# Patient Record
Sex: Female | Born: 2000 | Race: Black or African American | Hispanic: No | Marital: Single
Health system: Southern US, Community
[De-identification: ages and names within clinical notes are randomized; demographics above are authoritative.]

---
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
## 2021-10-01 ENCOUNTER — Emergency Department (HOSPITAL_COMMUNITY)
Admission: EM | Admit: 2021-10-01 | Discharge: 2021-10-01 | Disposition: A | Discharge status: Home / Self Care | Payer: 59 | Attending: Emergency Medicine | Admitting: Emergency Medicine

## 2021-10-01 ENCOUNTER — Other Ambulatory Visit: Payer: Self-pay

## 2021-10-01 ENCOUNTER — Emergency Department (HOSPITAL_COMMUNITY): Payer: 59

## 2021-10-01 DIAGNOSIS — R197 Diarrhea, unspecified: Secondary | ICD-10-CM | POA: Diagnosis not present

## 2021-10-01 DIAGNOSIS — R1084 Generalized abdominal pain: Secondary | ICD-10-CM | POA: Diagnosis not present

## 2021-10-01 DIAGNOSIS — R112 Nausea with vomiting, unspecified: Secondary | ICD-10-CM | POA: Diagnosis present

## 2021-10-01 DIAGNOSIS — D72829 Elevated white blood cell count, unspecified: Secondary | ICD-10-CM | POA: Diagnosis not present

## 2021-10-01 DIAGNOSIS — D75839 Thrombocytosis, unspecified: Secondary | ICD-10-CM | POA: Diagnosis not present

## 2021-10-01 LAB — CBC WITH DIFFERENTIAL/PLATELET
Abs Immature Granulocytes: 0.16 10*3/uL — ABNORMAL HIGH (ref 0.00–0.07)
Basophils Absolute: 0.1 10*3/uL (ref 0.0–0.1)
Basophils Relative: 0 %
Eosinophils Absolute: 0 10*3/uL (ref 0.0–0.5)
Eosinophils Relative: 0 %
HCT: 43.1 % (ref 36.0–46.0)
Hemoglobin: 13.2 g/dL (ref 12.0–15.0)
Immature Granulocytes: 1 %
Lymphocytes Relative: 8 %
Lymphs Abs: 2.1 10*3/uL (ref 0.7–4.0)
MCH: 23.1 pg — ABNORMAL LOW (ref 26.0–34.0)
MCHC: 30.6 g/dL (ref 30.0–36.0)
MCV: 75.3 fL — ABNORMAL LOW (ref 80.0–100.0)
Monocytes Absolute: 1.1 10*3/uL — ABNORMAL HIGH (ref 0.1–1.0)
Monocytes Relative: 4 %
Neutro Abs: 23.2 10*3/uL — ABNORMAL HIGH (ref 1.7–7.7)
Neutrophils Relative %: 87 %
Platelets: 422 10*3/uL — ABNORMAL HIGH (ref 150–400)
RBC: 5.72 MIL/uL — ABNORMAL HIGH (ref 3.87–5.11)
RDW: 15.6 % — ABNORMAL HIGH (ref 11.5–15.5)
WBC: 26.6 10*3/uL — ABNORMAL HIGH (ref 4.0–10.5)
nRBC: 0 % (ref 0.0–0.2)

## 2021-10-01 LAB — COMPREHENSIVE METABOLIC PANEL
ALT: 19 U/L (ref 0–44)
AST: 21 U/L (ref 15–41)
Albumin: 4.6 g/dL (ref 3.5–5.0)
Alkaline Phosphatase: 90 U/L (ref 38–126)
Anion gap: 12 (ref 5–15)
BUN: 15 mg/dL (ref 6–20)
CO2: 21 mmol/L — ABNORMAL LOW (ref 22–32)
Calcium: 10.8 mg/dL — ABNORMAL HIGH (ref 8.9–10.3)
Chloride: 105 mmol/L (ref 98–111)
Creatinine, Ser: 1.13 mg/dL — ABNORMAL HIGH (ref 0.44–1.00)
GFR, Estimated: >60 mL/min (ref >60)
Glucose, Bld: 168 mg/dL — ABNORMAL HIGH (ref 70–99)
Potassium: 4 mmol/L (ref 3.5–5.1)
Sodium: 138 mmol/L (ref 135–145)
Total Bilirubin: 0.3 mg/dL (ref 0.3–1.2)
Total Protein: 9.6 g/dL — ABNORMAL HIGH (ref 6.5–8.1)

## 2021-10-01 LAB — I-STAT BETA HCG BLOOD, ED (MC, WL, AP ONLY): I-stat hCG, quantitative: <5 m[IU]/mL (ref <5)

## 2021-10-01 LAB — LIPASE, BLOOD: Lipase: 35 U/L (ref 11–51)

## 2021-10-01 MED ORDER — ONDANSETRON HCL 4 MG/2ML IJ SOLN
4.0000 mg | Freq: Once | INTRAMUSCULAR | Status: AC
  Administered 2021-10-01: 4 mg via INTRAVENOUS
  Filled 2021-10-01: qty 2

## 2021-10-01 MED ORDER — LOPERAMIDE HCL 2 MG PO CAPS
2.0000 mg | ORAL_CAPSULE | Freq: Once | ORAL | Status: AC
  Administered 2021-10-01: 2 mg via ORAL
  Filled 2021-10-01: qty 1

## 2021-10-01 MED ORDER — SODIUM CHLORIDE 0.9 % IV BOLUS
1000.0000 mL | Freq: Once | INTRAVENOUS | Status: AC
  Administered 2021-10-01: 1000 mL via INTRAVENOUS

## 2021-10-01 MED ORDER — LOPERAMIDE HCL 2 MG PO CAPS: 2.0000 mg | ORAL_CAPSULE | Freq: Four times a day (QID) | ORAL | 0 refills | Status: AC | PRN

## 2021-10-01 MED ORDER — SODIUM CHLORIDE (PF) 0.9 % IJ SOLN
INTRAMUSCULAR | Status: AC
  Filled 2021-10-01: qty 50

## 2021-10-01 MED ORDER — ONDANSETRON 4 MG PO TBDP: 4.0000 mg | ORAL_TABLET | Freq: Three times a day (TID) | ORAL | 0 refills | Status: AC | PRN

## 2021-10-01 MED ORDER — IOHEXOL 300 MG/ML  SOLN
100.0000 mL | Freq: Once | INTRAMUSCULAR | Status: AC | PRN
  Administered 2021-10-01: 100 mL via INTRAVENOUS

## 2021-10-01 NOTE — ED Provider Notes (Signed)
Emergency Department Provider Note   I have reviewed the triage vital signs and the nursing notes.   HISTORY  Chief Complaint Emesis and Diarrhea   HPI Jessica Gamble is a 21 y.o. female with no significant past medical or surgical history presents to the emergency department with acute onset nausea with vomiting and diarrhea.  Symptoms began around 7 PM, shortly after dinner.  She states that she ate a home-cooked meal with her parents who are not feeling symptoms.  She went upstairs began to have vomiting and then developed multiple episodes of nonbloody vomiting with diarrhea.  Denies any recent travel, hospitalization, or antibiotic.  No fevers.  She denies abdominal pain but is feeling some diffuse abdominal cramping.  No severe lower abdominal discomfort or UTI symptoms.    No past medical history on file.  Review of Systems  Constitutional: No fever/chills Eyes: No visual changes. ENT: No sore throat. Cardiovascular: Denies chest pain. Respiratory: Denies shortness of breath. Gastrointestinal: Positive abdominal cramping pain. Positive nausea, vomiting, and diarrhea.  No constipation. Genitourinary: Negative for dysuria. Musculoskeletal: Negative for back pain. Skin: Negative for rash. Neurological: Negative for headaches, focal weakness or numbness.  ____________________________________________   PHYSICAL EXAM:  VITAL SIGNS: ED Triage Vitals  Enc Vitals Group     BP 10/01/21 0014 (!) 121/92     Pulse Rate 10/01/21 0014 89     Resp 10/01/21 0014 18     Temp 10/01/21 0014 98 F (36.7 C)     Temp Source 10/01/21 0014 Oral     SpO2 10/01/21 0014 99 %     Weight 10/01/21 0012 240 lb (108.9 kg)     Height 10/01/21 0012 5\' 6"  (1.676 m)   Constitutional: Alert and oriented. Well appearing and in no acute distress. Eyes: Conjunctivae are normal.  Head: Atraumatic. Nose: No congestion/rhinnorhea. Mouth/Throat: Mucous membranes are moist.  Neck: No stridor.    Cardiovascular: Normal rate, regular rhythm. Good peripheral circulation. Grossly normal heart sounds.   Respiratory: Normal respiratory effort.  No retractions. Lungs CTAB. Gastrointestinal: Soft and nontender. Specifically no RUQ tenderness. No distention.  Musculoskeletal: No lower extremity tenderness nor edema. No gross deformities of extremities. Neurologic:  Normal speech and language. No gross focal neurologic deficits are appreciated.  Skin:  Skin is warm, dry and intact. No rash noted.   ____________________________________________   LABS (all labs ordered are listed, but only abnormal results are displayed)  Labs Reviewed  COMPREHENSIVE METABOLIC PANEL - Abnormal; Notable for the following components:      Result Value   CO2 21 (*)    Glucose, Bld 168 (*)    Creatinine, Ser 1.13 (*)    Calcium 10.8 (*)    Total Protein 9.6 (*)    All other components within normal limits  CBC WITH DIFFERENTIAL/PLATELET - Abnormal; Notable for the following components:   WBC 26.6 (*)    RBC 5.72 (*)    MCV 75.3 (*)    MCH 23.1 (*)    RDW 15.6 (*)    Platelets 422 (*)    Neutro Abs 23.2 (*)    Monocytes Absolute 1.1 (*)    Abs Immature Granulocytes 0.16 (*)    All other components within normal limits  LIPASE, BLOOD  I-STAT BETA HCG BLOOD, ED ([facility], [facility], [facility] ONLY)   ____________________________________________  RADIOLOGY  CT ABDOMEN PELVIS W CONTRAST  Result Date: 10/01/2021 CLINICAL DATA:  Nausea and vomiting with abdominal pain. EXAM: CT ABDOMEN AND PELVIS  WITH CONTRAST TECHNIQUE: Multidetector CT imaging of the abdomen and pelvis was performed using the standard protocol following bolus administration of intravenous contrast. RADIATION DOSE REDUCTION: This exam was performed according to the departmental dose-optimization program which includes automated exposure control, adjustment of the mA and/or kV according to patient size and/or use of iterative reconstruction technique.  CONTRAST:  136mL OMNIPAQUE IOHEXOL 300 MG/ML  SOLN COMPARISON:  None Available. FINDINGS: Lower chest: No acute abnormality. Hepatobiliary: No focal liver abnormality is seen. No gallstones, gallbladder wall thickening, or biliary dilatation. Pancreas: Unremarkable. No pancreatic ductal dilatation or surrounding inflammatory changes. Spleen: Normal in size without focal abnormality. Adrenals/Urinary Tract: Adrenal glands are unremarkable. Kidneys are normal, without renal calculi, focal lesion, or hydronephrosis. Bladder is unremarkable. Stomach/Bowel: No dilated bowel loops, wall thickening or focal inflammation. Small bowel is completely fluid-filled which can be seen with enteritis. Additionally, the colon is fluid-filled which can be seen in the setting of diarrhea. The appendix appears within normal limits. The stomach is within normal limits. Vascular/Lymphatic: No significant vascular findings are present. No enlarged abdominal or pelvic lymph nodes. Reproductive: Uterus and bilateral adnexa are unremarkable. Other: No abdominal wall hernia or abnormality. No abdominopelvic ascites. Musculoskeletal: No acute or significant osseous findings. IMPRESSION: 1. Diffuse fluid distention of small bowel suggestive of enteritis. No bowel obstruction. 2. There is also fluid throughout the colon which can be seen in the setting of diarrhea. No focal inflammation. Electronically Signed   By: Ronney Asters M.D.   On: 10/01/2021 03:44    ____________________________________________   PROCEDURES  Procedure(s) performed:   Procedures  None  ____________________________________________   INITIAL IMPRESSION / ASSESSMENT AND PLAN / ED COURSE  Pertinent labs & imaging results that were available during my care of the patient were reviewed by me and considered in my medical decision making (see chart for details).   This patient is Presenting for Evaluation of nausea/vomiting/diarrhea, which does require a range  of treatment options, and is a complaint that involves a high risk of morbidity and mortality.  The Differential Diagnoses includes but is not exclusive to acute cholecystitis, intrathoracic causes for epigastric abdominal pain, gastritis, duodenitis, pancreatitis, small bowel or large bowel obstruction, abdominal aortic aneurysm, hernia, gastritis, etc.   Critical Interventions-    Medications  sodium chloride (PF) 0.9 % injection (has no administration in time range)  sodium chloride 0.9 % bolus 1,000 mL (0 mLs Intravenous Stopped 10/01/21 0148)  ondansetron (ZOFRAN) injection 4 mg (4 mg Intravenous Given 10/01/21 0049)  loperamide (IMODIUM) capsule 2 mg (2 mg Oral Given 10/01/21 0049)  iohexol (OMNIPAQUE) 300 MG/ML solution 100 mL (100 mLs Intravenous Contrast Given 10/01/21 0322)    Reassessment after intervention: Symptoms improved.    I decided to review pertinent External Data, and in summary no recent ED visits for similar.   Clinical Laboratory Tests Ordered, included CBC shows significant leukocytosis to 26.6.  Hemoglobin is normal.  Platelets also elevated.  LFTs and bilirubin are normal.  No gap acidosis.  Pregnancy test is negative.  Radiologic Tests Ordered, included CT abdomen/pelvis. I independently interpreted the images and agree with radiology interpretation.    Social Determinants of Health Risk Denies EtOH or drugs.    Medical Decision Making: Summary:  Patient presents to the emergency department with nausea, vomiting, diarrhea.  Symptoms began suddenly this evening.  Diffusely nontender abdomen.  Plan to defer abdominal imaging for now although this was considered.  Plan for IV fluids along with symptom management and  screening blood work.   Reevaluation with update and discussion with patient. She is feeling much better on re-evaluation. Continues to have some mild symptoms. Given significant leukocytosis, plan for abdominal imaging and reassess.  Considered  admission but patient feeling well. CT reassuring. No acute findings. Plan for discharge with strict ED return precautions.   Disposition: discharge  ____________________________________________  FINAL CLINICAL IMPRESSION(S) / ED DIAGNOSES  Final diagnoses:  Nausea vomiting and diarrhea     NEW OUTPATIENT MEDICATIONS STARTED DURING THIS VISIT:  New Prescriptions   LOPERAMIDE (IMODIUM) 2 MG CAPSULE    Take 1 capsule (2 mg total) by mouth 4 (four) times daily as needed for diarrhea or loose stools.   ONDANSETRON (ZOFRAN-ODT) 4 MG DISINTEGRATING TABLET    Take 1 tablet (4 mg total) by mouth every 8 (eight) hours as needed for nausea or vomiting.    Note:  This document was prepared using Dragon voice recognition software and may include unintentional dictation errors.  Nanda Quinton, MD, [facility]    Kersten Salmons, [facility], MD 10/01/21 [phone]

## 2021-10-01 NOTE — ED Notes (Signed)
Parents at bedside

## 2021-10-01 NOTE — Discharge Instructions (Signed)

## 2021-10-01 NOTE — ED Notes (Signed)
Patient transported to CT 

## 2021-10-01 NOTE — ED Triage Notes (Addendum)
Pt reports with diarrhea, vomiting, and body aches since 7 pm last night.
